# Patient Record
Sex: Male | Born: 1975 | Race: White | Hispanic: No | Marital: Married | Smoking: Current every day smoker
Health system: Southern US, Community
[De-identification: ages and names within clinical notes are randomized; demographics above are authoritative.]

## PROBLEM LIST (undated history)

## (undated) HISTORY — PX: NO PAST SURGERIES: SHX2092

---

## 2010-11-14 ENCOUNTER — Emergency Department: Payer: Self-pay | Admitting: Unknown Physician Specialty

## 2012-04-14 IMAGING — CR DG SHOULDER 3+V*L*
1 series · 3 of 3 positions shown · non-contrast
Comparison: none

REASON FOR EXAM: fall; pain
COMMENTS:

PROCEDURE:     DXR - DXR SHOULDER LEFT COMPLETE  - November 14, 2010  [DATE]
RESULT:
A comminuted mid to distal clavicle fracture is identified demonstrating
approximately 3 cm of override. There is inferior displacement of the distal
fragment.

[Series 1: view not recorded · 0.17mm/px · 3 of 3 slices shown]
[im 1/3]
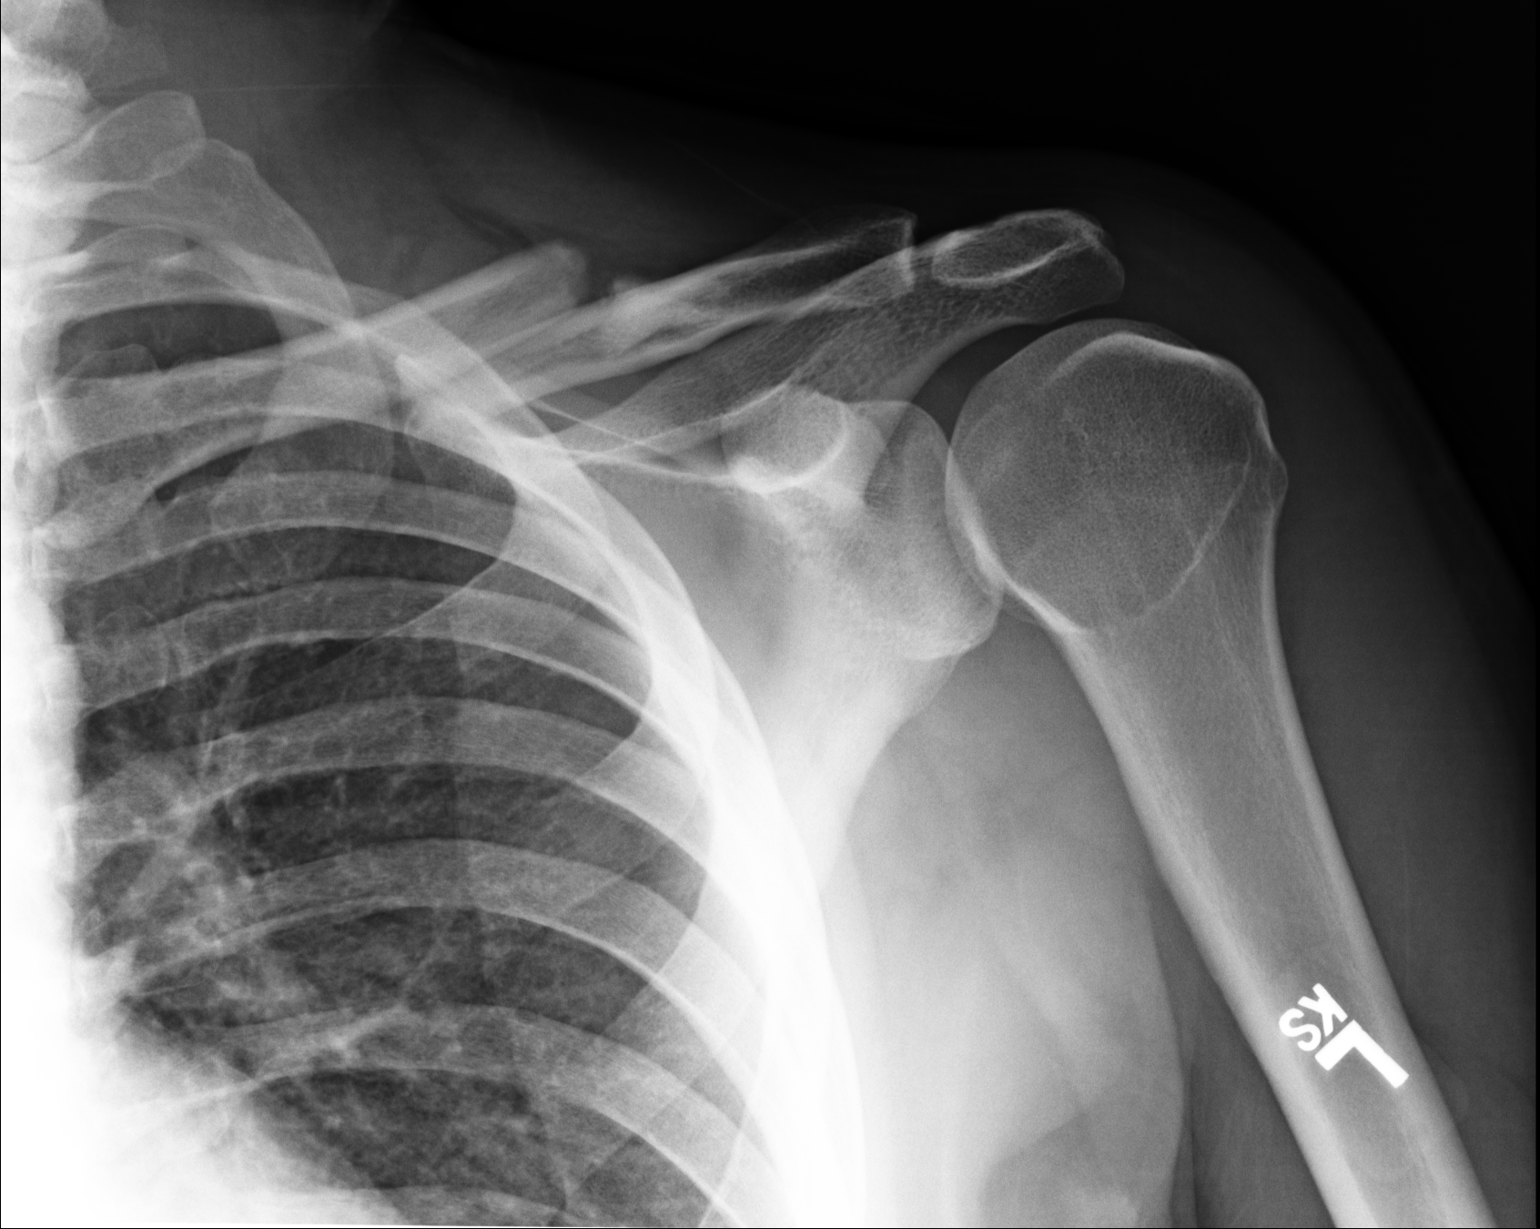
[im 2/3]
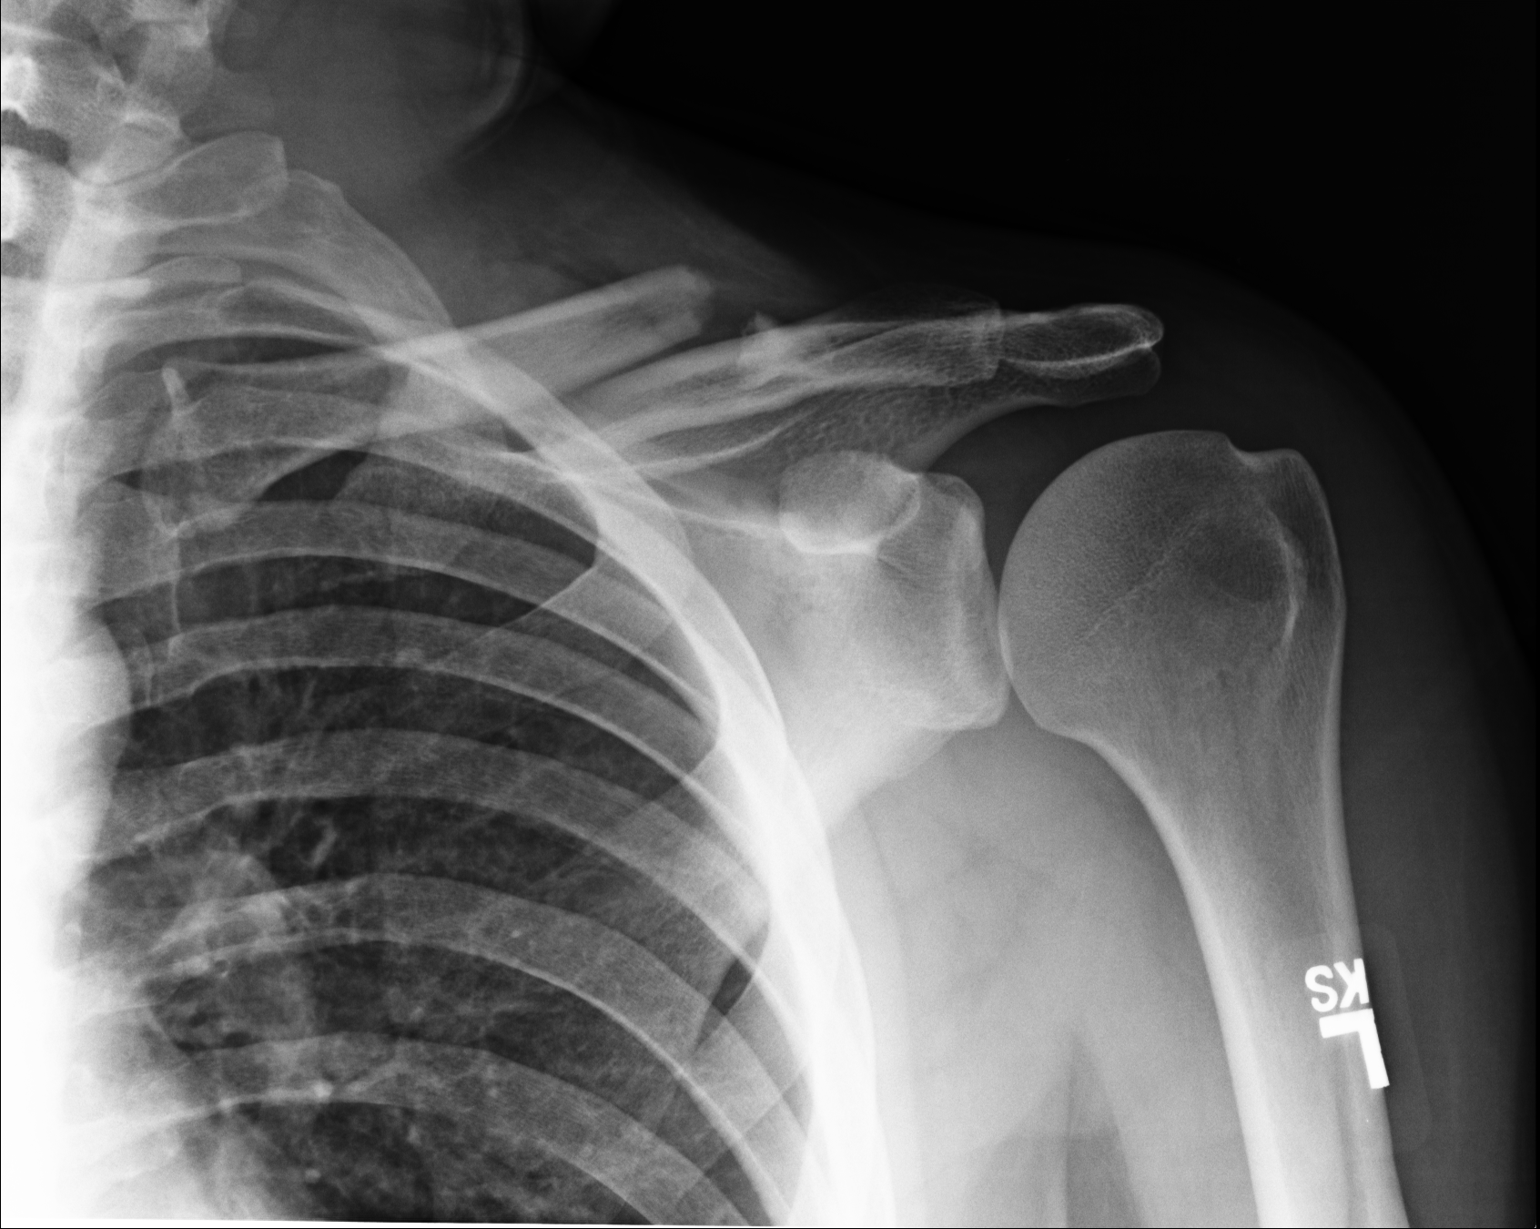
[im 3/3]
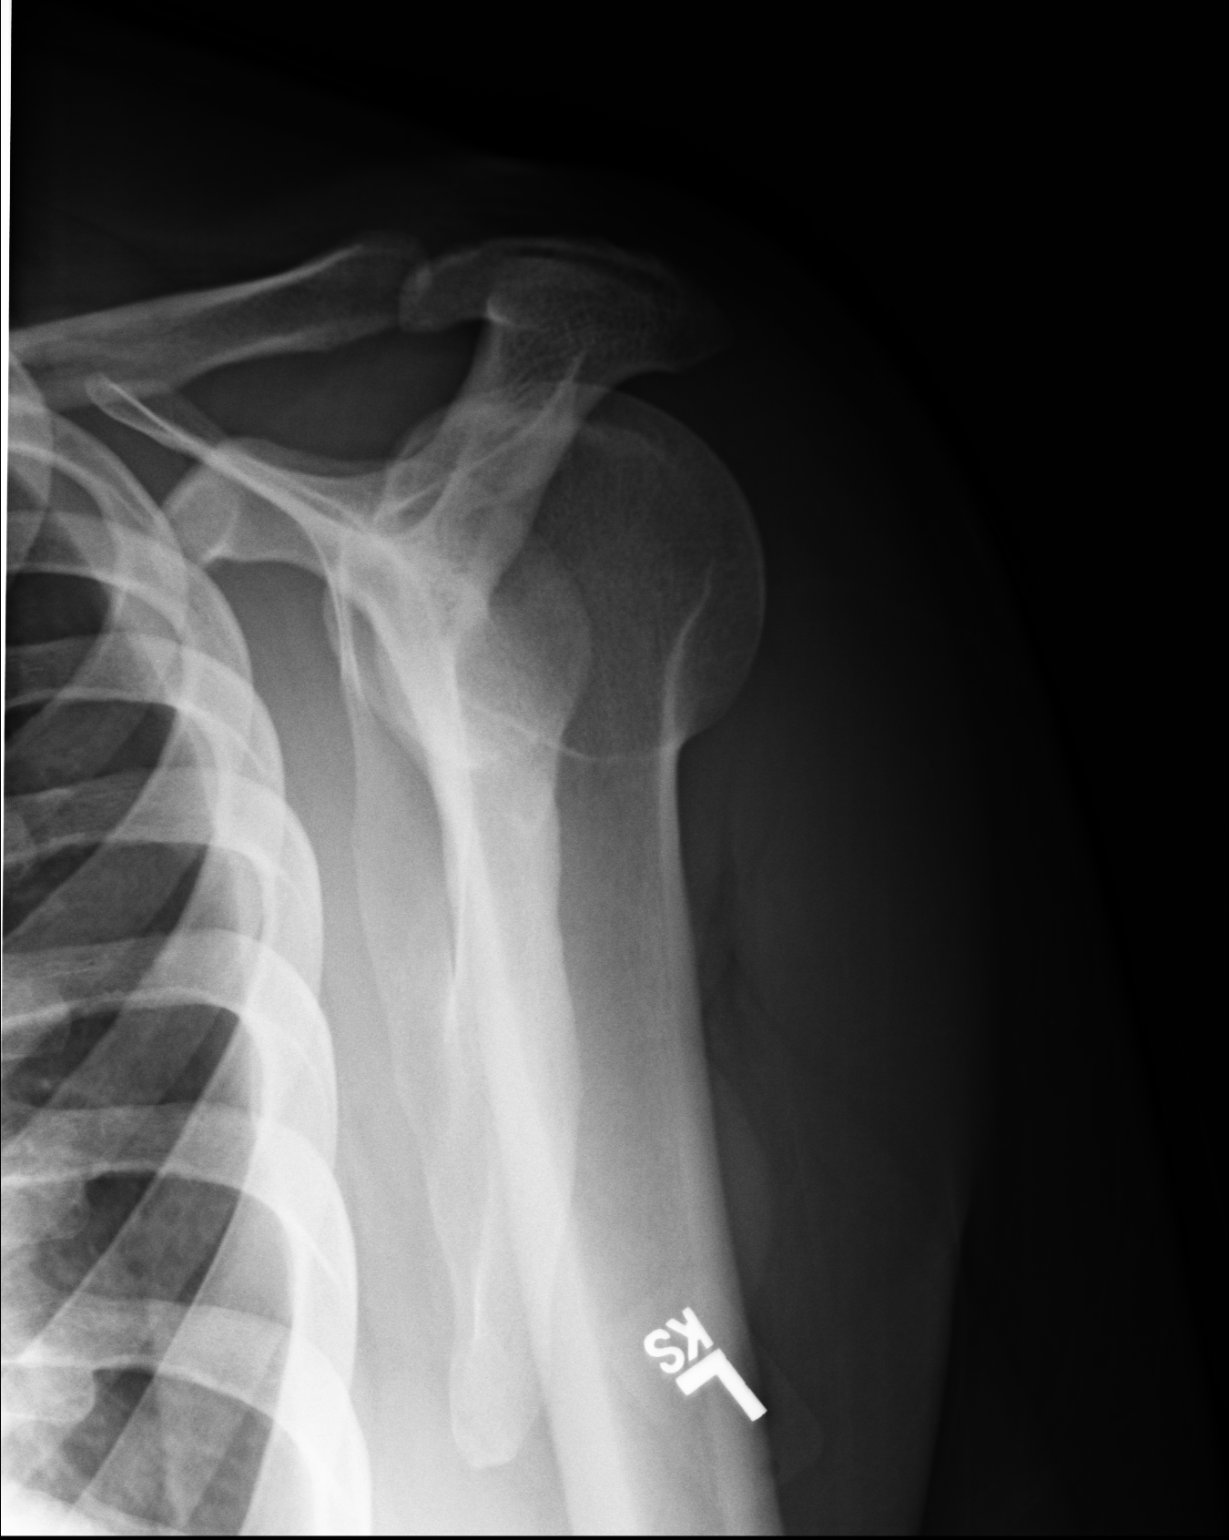

[3 of 3 positions shown; findings below may reference images not displayed]

IMPRESSION: Comminuted clavicle fracture.

## 2016-11-29 ENCOUNTER — Encounter: Payer: Self-pay | Admitting: Emergency Medicine

## 2016-11-29 ENCOUNTER — Ambulatory Visit
Admission: EM | Admit: 2016-11-29 | Discharge: 2016-11-29 | Disposition: A | Discharge status: Home / Self Care | Payer: No Typology Code available for payment source | Attending: Physician Assistant | Admitting: Physician Assistant

## 2016-11-29 DIAGNOSIS — J329 Chronic sinusitis, unspecified: Secondary | ICD-10-CM | POA: Diagnosis not present

## 2016-11-29 DIAGNOSIS — J45909 Unspecified asthma, uncomplicated: Secondary | ICD-10-CM

## 2016-11-29 DIAGNOSIS — J3489 Other specified disorders of nose and nasal sinuses: Secondary | ICD-10-CM | POA: Diagnosis not present

## 2016-11-29 MED ORDER — AZITHROMYCIN 250 MG PO TABS: 250.0000 mg | ORAL_TABLET | Freq: Every day | ORAL | 0 refills | Status: DC

## 2016-11-29 NOTE — ED Provider Notes (Signed)
CSN: [id number]     Arrival date & time 11/29/16  1546 History   First MD Initiated Contact with Patient 11/29/16 1657     Chief Complaint  Patient presents with  . Sinusitis   (Consider location/radiation/quality/duration/timing/severity/associated sxs/prior Treatment) Patient is a 41 year old male with history of nonspecific asthma who presents with a complaint of sinus pressure. Patient states his symptoms are as a head cold last week and initially thought he was starting to feel better now reports sinus pressure under his eyes with some mild headache. Patient states he has taken TheraFlu and DayQuil which is help with his nasal congestion and itchy throat but not with the pressure. Patient denies any fever, chills, chest pain, shortness of breath, abdominal pain, and denies any nausea. Patient states his symptoms are not affecting his asthma this time. Patient denies any history of seasonal allergy issues.      History reviewed. No pertinent past medical history. History reviewed. No pertinent surgical history. History reviewed. No pertinent family history. Social History  Substance Use Topics  . Smoking status: Current Every Day Smoker    Packs/day: 0.50  . Smokeless tobacco: Never Used  . Alcohol use Yes    Review of Systems  Constitutional: Negative for chills and fatigue.  HENT: Positive for congestion, rhinorrhea, sinus pressure and sore throat.   Respiratory: Negative for cough, chest tightness and shortness of breath.   Cardiovascular: Negative for chest pain.  Gastrointestinal: Positive for abdominal pain. Negative for nausea.  All other systems reviewed and are negative.   Allergies  Patient has no known allergies.  Home Medications   Prior to Admission medications   Medication Sig Start Date End Date Taking? Authorizing Provider  azithromycin (ZITHROMAX Z-PAK) 250 MG tablet Take 1 tablet (250 mg total) by mouth daily. Take 2 tablets by mouth the first day  followed by one tablet daily for next 4 days. 11/29/16   Candis Schatz, PA-C   Meds Ordered and Administered this Visit  Medications - No data to display  BP 119/77 (BP Location: Left Arm)   Pulse 72   Temp 97.8 F (36.6 C) (Oral)   Resp 18   Ht 6' (1.829 m)   Wt 232 lb (105.2 kg)   SpO2 97%   BMI 31.46 kg/m  No data found.   Physical Exam  Constitutional: He is oriented to person, place, and time. He appears well-developed and well-nourished.  HENT:  Head: Normocephalic and atraumatic.  Right Ear: No mastoid tenderness. Tympanic membrane is bulging. No middle ear effusion.  Left Ear: No mastoid tenderness. Tympanic membrane is bulging.  No middle ear effusion.  Nose: Mucosal edema and rhinorrhea present. Right sinus exhibits maxillary sinus tenderness and frontal sinus tenderness. Left sinus exhibits maxillary sinus tenderness and frontal sinus tenderness.  Mouth/Throat: Uvula is midline and mucous membranes are normal. Posterior oropharyngeal erythema present.  Eyes: Conjunctivae and EOM are normal. Pupils are equal, round, and reactive to light.  Neck: Normal range of motion. Neck supple.  Cardiovascular: Normal rate, regular rhythm and normal heart sounds.   Pulmonary/Chest: Effort normal and breath sounds normal. He has no wheezes.  Abdominal: Soft.  Musculoskeletal: Normal range of motion.  Lymphadenopathy:    He has no cervical adenopathy.  Neurological: He is alert and oriented to person, place, and time.    Urgent Care Course     Procedures (including critical care time)  Labs Review Labs Reviewed - No data to display  Imaging Review No  results found.    MDM   1. Uncomplicated asthma, unspecified asthma severity, unspecified whether persistent   2. Sinus pressure   3. Sinusitis, unspecified chronicity, unspecified location    New Prescriptions   AZITHROMYCIN (ZITHROMAX Z-PAK) 250 MG TABLET    Take 1 tablet (250 mg total) by mouth daily. Take 2  tablets by mouth the first day followed by one tablet daily for next 4 days.  Patient presents with sinus pressure that began 1 week ago as a "head cold" initially thought was getting better. Patient is treated with TheraFlu and DayQuil which has helped with his congestion and scratchy throat but he reports the sinus pressure is worsening. If patient a prescription for azithromycin and instructions regarding nasal rinse. Patient also advised used Tylenol and ibuprofen for pain as needed. Patient advised to follow with here with his primary care physician as needed should his symptoms worsen or not improve. Patient verbalizes understanding and agreement with the plan.   Candis SchatzMichael D Jackilyn Umphlett, PA-C    Candis SchatzHarris, Khaden Gater D, PA-C 11/29/16 [phone]

## 2016-11-29 NOTE — Discharge Instructions (Signed)
-  Azithromycin: Take 2 tablets by mouth the first day followed by one tablet daily for next 4 days. -can use OTC nasal saline rinse to help clear out sinuses and relieve pressure -can use Tylenol and ibuprofen for pain -drink plenty of fluids -follow up with PCP or here if symptoms worsen or do not improve

## 2016-11-29 NOTE — ED Triage Notes (Signed)
Patient states he felt like he was getting a cold last week and today is having a lot of sinus pressure and pain

## 2021-01-23 ENCOUNTER — Other Ambulatory Visit: Payer: Self-pay

## 2021-01-23 ENCOUNTER — Ambulatory Visit
Admission: RE | Admit: 2021-01-23 | Discharge: 2021-01-23 | Disposition: A | Discharge status: Home / Self Care | Payer: 59 | Source: Ambulatory Visit | Attending: Family Medicine | Admitting: Family Medicine

## 2021-01-23 VITALS — BP 125/86 | HR 75 | Temp 97.8°F | Resp 16 | Ht 72.0 in | Wt 225.0 lb

## 2021-01-23 DIAGNOSIS — J0100 Acute maxillary sinusitis, unspecified: Secondary | ICD-10-CM

## 2021-01-23 MED ORDER — AMOXICILLIN-POT CLAVULANATE 875-125 MG PO TABS: 1.0000 | ORAL_TABLET | Freq: Two times a day (BID) | ORAL | 0 refills | Status: AC

## 2021-01-23 NOTE — ED Triage Notes (Signed)
Patient c/o nasal congestion, sinus pressure and congestion that started a week ago.  Patient denies fevers.

## 2021-01-23 NOTE — ED Provider Notes (Signed)
[facility]    CSN: [id number] Arrival date & time: 01/23/21  4656  History   Chief Complaint Chief Complaint  Patient presents with   Sinus Problem   Nasal Congestion    HPI 45 year old male presents with the above complaint.  Patient reports that he has had a 1 week history of sinus pressure and congestion.  He also reports chest congestion.  No resolution with over-the-counter treatment.  No fever.  No reported sick contacts.  He has a smoker.  No other associated symptoms.  No other complaints.  Home Medications    Prior to Admission medications   Medication Sig Start Date End Date Taking? Authorizing Provider  amoxicillin-clavulanate (AUGMENTIN) 875-125 MG tablet Take 1 tablet by mouth 2 (two) times daily. 01/23/21  Yes Tommie Sams, DO   Social History Social History   Tobacco Use   Smoking status: Every Day    Packs/day: 0.50    Types: Cigarettes   Smokeless tobacco: Never  Vaping Use   Vaping Use: Never used  Substance Use Topics   Alcohol use: Yes   Drug use: Never     Allergies   Patient has no known allergies.   Review of Systems Review of Systems Per HPI  Physical Exam Triage Vital Signs ED Triage Vitals  Enc Vitals Group     BP 01/23/21 0945 125/86     Pulse Rate 01/23/21 0945 75     Resp 01/23/21 0945 16     Temp 01/23/21 0945 97.8 F (36.6 C)     Temp Source 01/23/21 0945 Oral     SpO2 01/23/21 0945 96 %     Weight 01/23/21 0942 225 lb (102.1 kg)     Height 01/23/21 0942 6' (1.829 m)     Head Circumference --      Peak Flow --      Pain Score 01/23/21 0942 0     Pain Loc --      Pain Edu? --      Excl. in GC? --    Updated Vital Signs BP 125/86 (BP Location: Right Arm)   Pulse 75   Temp 97.8 F (36.6 C) (Oral)   Resp 16   Ht 6' (1.829 m)   Wt 102.1 kg   SpO2 96%   BMI 30.52 kg/m   Visual Acuity Right Eye Distance:   Left Eye Distance:   Bilateral Distance:    Right Eye Near:   Left Eye Near:     Bilateral Near:     Physical Exam Vitals and nursing note reviewed.  Constitutional:      General: He is not in acute distress.    Appearance: Normal appearance. He is not ill-appearing.  HENT:     Head: Normocephalic and atraumatic.     Right Ear: Tympanic membrane normal.     Left Ear: Tympanic membrane normal.     Nose: Congestion present.     Mouth/Throat:     Pharynx: No oropharyngeal exudate.  Eyes:     General:        Right eye: No discharge.        Left eye: No discharge.     Conjunctiva/sclera: Conjunctivae normal.  Cardiovascular:     Rate and Rhythm: Normal rate and regular rhythm.     Heart sounds: No murmur heard. Pulmonary:     Effort: Pulmonary effort is normal.     Breath sounds: Normal breath sounds. No wheezing, rhonchi or rales.  Neurological:     Mental Status: He is alert.  Psychiatric:        Mood and Affect: Mood normal.        Behavior: Behavior normal.     UC Treatments / Results  Labs (all labs ordered are listed, but only abnormal results are displayed) Labs Reviewed - No data to display  EKG   Radiology No results found.  Procedures Procedures (including critical care time)  Medications Ordered in UC Medications - No data to display  Initial Impression / Assessment and Plan / UC Course  I have reviewed the triage vital signs and the nursing notes.  Pertinent labs & imaging results that were available during my care of the patient were reviewed by me and considered in my medical decision making (see chart for details).    45 year old male presents with sinusitis.  Treating with Augmentin.  Final Clinical Impressions(s) / UC Diagnoses   Final diagnoses:  Acute maxillary sinusitis, recurrence not specified   Discharge Instructions   None    ED Prescriptions     Medication Sig Dispense Auth. Provider   amoxicillin-clavulanate (AUGMENTIN) 875-125 MG tablet Take 1 tablet by mouth 2 (two) times daily. 14 tablet Tommie Sams, DO      PDMP not reviewed this encounter.   Tommie Sams, [place] 01/23/21 1048

## 2022-02-28 ENCOUNTER — Emergency Department
Admission: EM | Admit: 2022-02-28 | Discharge: 2022-02-28 | Disposition: A | Discharge status: Home / Self Care | Payer: 59 | Attending: Emergency Medicine | Admitting: Emergency Medicine

## 2022-02-28 ENCOUNTER — Emergency Department: Payer: 59

## 2022-02-28 ENCOUNTER — Other Ambulatory Visit: Payer: Self-pay

## 2022-02-28 DIAGNOSIS — S61211A Laceration without foreign body of left index finger without damage to nail, initial encounter: Secondary | ICD-10-CM | POA: Diagnosis not present

## 2022-02-28 DIAGNOSIS — S61412A Laceration without foreign body of left hand, initial encounter: Secondary | ICD-10-CM

## 2022-02-28 DIAGNOSIS — Z23 Encounter for immunization: Secondary | ICD-10-CM | POA: Diagnosis not present

## 2022-02-28 DIAGNOSIS — W278XXA Contact with other nonpowered hand tool, initial encounter: Secondary | ICD-10-CM | POA: Diagnosis not present

## 2022-02-28 DIAGNOSIS — S6992XA Unspecified injury of left wrist, hand and finger(s), initial encounter: Secondary | ICD-10-CM | POA: Diagnosis present

## 2022-02-28 MED ORDER — LIDOCAINE HCL (PF) 1 % IJ SOLN
5.0000 mL | Freq: Once | INTRAMUSCULAR | Status: AC
  Administered 2022-02-28: 5 mL
  Filled 2022-02-28: qty 5

## 2022-02-28 MED ORDER — TETANUS-DIPHTH-ACELL PERTUSSIS 5-2.5-18.5 LF-MCG/0.5 IM SUSY
0.5000 mL | PREFILLED_SYRINGE | Freq: Once | INTRAMUSCULAR | Status: AC
  Administered 2022-02-28: 0.5 mL via INTRAMUSCULAR
  Filled 2022-02-28: qty 0.5

## 2022-02-28 MED ORDER — BACITRACIN ZINC 500 UNIT/GM EX OINT
TOPICAL_OINTMENT | CUTANEOUS | Status: AC
  Filled 2022-02-28: qty 0.9

## 2022-02-28 NOTE — Discharge Instructions (Signed)
   Please keep your wound clean by washing at least daily with soap and water. If you see any signs of infection like spreading redness, pus coming from the wound, extreme pain, fevers, chills or any other worsening doctor right away or come back to the emergency department.  Use antibiotic ointment after washing and apply a bandage.   Thank you for choosing [place] for your health care today!  Please see your primary doctor this week for a follow up appointment. Get sutures removed in 7 days.   If you do not have a primary doctor call the following clinics to establish care:  If you have insurance:  [facility] [phone] [address] [place] [place] [address]   [facility]  [phone] [address] [place] [place] [address]   If you do not have insurance:  [facility]  [phone] [address], [place] [address]  Sometimes, in the early stages of certain disease courses it is difficult to detect in the emergency department evaluation -- so, it is important that you continue to monitor your symptoms and call your doctor right away or return to the emergency department if you develop any new or worsening symptoms.  It was my pleasure to care for you today.   Daneil Dan Modesto Charon, MD

## 2022-02-28 NOTE — ED Provider Notes (Signed)
[facility] Provider Note    Event Date/Time   First MD Initiated Contact with Patient 02/28/22 1429     (approximate)   History   Laceration   HPI  Benjamin Lyons is a 46 y.o. male   This is a very pleasant 46 year old man who presents with his wife after an injury sustained to his left dorsum of index finger after cutting with a box cutter accidentally.  No other injuries.  History was obtained via patient and his wife who is at bedside      Physical Exam   Triage Vital Signs: ED Triage Vitals  Enc Vitals Group     BP 02/28/22 1346 110/78     Pulse Rate 02/28/22 1346 80     Resp 02/28/22 1346 18     Temp 02/28/22 1346 97.7 F (36.5 C)     Temp Source 02/28/22 1346 Oral     SpO2 02/28/22 1346 95 %     Weight 02/28/22 1347 230 lb (104.3 kg)     Height 02/28/22 1347 6' (1.829 m)     Head Circumference --      Peak Flow --      Pain Score 02/28/22 1346 0     Pain Loc --      Pain Edu? --      Excl. in GC? --     Most recent vital signs: Vitals:   02/28/22 1346  BP: 110/78  Pulse: 80  Resp: 18  Temp: 97.7 F (36.5 C)  SpO2: 95%    General: Awake, no distress.  CV:  Good peripheral perfusion.  Resp:  Normal effort.  Abd:  No distention.  Other:  He has a 3 cm laceration to the dorsum of the left hand at the base of the index finger.  Gaping.  Hemostatic.  Full active range of motion, neurovascular intact.   ED Results / Procedures / Treatments     RADIOLOGY I independently reviewed and interpreted x-ray of the hand and see no obvious fractures, dislocations or foreign bodies.   PROCEDURES:  Critical Care performed: No  ..Laceration Repair  Date/Time: 02/28/2022 5:01 PM  Performed by: Pilar Jarvis, MD Authorized by: Pilar Jarvis, MD   Consent:    Consent obtained:  Verbal   Consent given by:  Patient   Risks, benefits, and alternatives were discussed: yes     Risks discussed:  Infection, pain, need for additional  repair and poor cosmetic result   Alternatives discussed:  No treatment and delayed treatment Universal protocol:    Procedure explained and questions answered to patient or proxy's satisfaction: yes     Relevant documents present and verified: yes     Test results available: no     Imaging studies available: yes     Patient identity confirmed:  Verbally with patient Anesthesia:    Anesthesia method:  Local infiltration   Local anesthetic:  Lidocaine 1% w/o epi Laceration details:    Location:  Hand   Hand location:  L hand, dorsum   Length (cm):  3   Depth (mm):  3 Pre-procedure details:    Preparation:  Patient was prepped and draped in usual sterile fashion and imaging obtained to evaluate for foreign bodies Exploration:    Limited defect created (wound extended): no     Hemostasis achieved with:  Direct pressure   Imaging obtained: x-ray     Imaging outcome: foreign body not noted     Wound  exploration: wound explored through full range of motion     Wound extent: no fascia violation noted, no foreign bodies/material noted and no muscle damage noted     Contaminated: no   Treatment:    Area cleansed with:  Saline   Amount of cleaning:  Standard   Irrigation solution:  Tap water   Irrigation method:  Tap   Visualized foreign bodies/material removed: no     Debridement:  None   Undermining:  None   Scar revision: no   Skin repair:    Repair method:  Sutures   Suture size:  5-0   Suture material:  Nylon   Suture technique:  Simple interrupted   Number of sutures:  4 Approximation:    Approximation:  Close Repair type:    Repair type:  Simple Post-procedure details:    Dressing:  Antibiotic ointment and adhesive bandage   Procedure completion:  Tolerated    MEDICATIONS ORDERED IN ED: Medications  lidocaine (PF) (XYLOCAINE) 1 % injection 5 mL (5 mLs Other Given 02/28/22 1700)  Tdap (BOOSTRIX) injection 0.5 mL (0.5 mLs Intramuscular Given 02/28/22 1659)  bacitracin  500 UNIT/GM ointment (  Given 02/28/22 1700)     IMPRESSION / MDM / ASSESSMENT AND PLAN / ED COURSE  I reviewed the triage vital signs and the nursing notes.                              Differential diagnosis includes, but is not limited to, fracture, dislocation, laceration, assess for but less likely tendon injury given full range of motion, assess for but less likely nerve injury given sensate.   MDM: lac repair as above, Tdap, advised on wound care and follow-up instructions.   Patient's presentation is most consistent with acute, uncomplicated illness.       FINAL CLINICAL IMPRESSION(S) / ED DIAGNOSES   Final diagnoses:  Laceration of left hand without foreign body, initial encounter     Rx / DC Orders   ED Discharge Orders     None        Note:  This document was prepared using Dragon voice recognition software and may include unintentional dictation errors.    Pilar Jarvis, MD 02/28/22 [phone]

## 2022-02-28 NOTE — ED Triage Notes (Signed)
Pt states he cut the top of his L hand with a knife while laying flooring- pt has about an inch cut on his pointer finger on the L hand

## 2024-05-23 ENCOUNTER — Encounter: Payer: Self-pay | Admitting: Gastroenterology

## 2024-07-09 ENCOUNTER — Ambulatory Visit: Admitting: Certified Registered"

## 2024-07-09 ENCOUNTER — Encounter: Payer: Self-pay | Admitting: Gastroenterology

## 2024-07-09 ENCOUNTER — Encounter
Admission: RE | Disposition: A | Discharge status: Home / Self Care | Payer: Self-pay | Source: Home / Self Care | Attending: Gastroenterology

## 2024-07-09 ENCOUNTER — Other Ambulatory Visit: Payer: Self-pay

## 2024-07-09 ENCOUNTER — Ambulatory Visit
Admission: RE | Admit: 2024-07-09 | Discharge: 2024-07-09 | Disposition: A | Attending: Gastroenterology | Admitting: Gastroenterology

## 2024-07-09 DIAGNOSIS — F17200 Nicotine dependence, unspecified, uncomplicated: Secondary | ICD-10-CM | POA: Insufficient documentation

## 2024-07-09 DIAGNOSIS — Z1211 Encounter for screening for malignant neoplasm of colon: Secondary | ICD-10-CM | POA: Diagnosis present

## 2024-07-09 DIAGNOSIS — K621 Rectal polyp: Secondary | ICD-10-CM | POA: Insufficient documentation

## 2024-07-09 HISTORY — PX: POLYPECTOMY: SHX149

## 2024-07-09 HISTORY — PX: COLONOSCOPY: SHX5424

## 2024-07-09 SURGERY — COLONOSCOPY
Anesthesia: General

## 2024-07-09 MED ORDER — SODIUM CHLORIDE 0.9 % IV SOLN: INTRAVENOUS | Status: DC

## 2024-07-09 MED ORDER — LIDOCAINE HCL (CARDIAC) PF 100 MG/5ML IV SOSY
PREFILLED_SYRINGE | INTRAVENOUS | Status: DC | PRN
  Administered 2024-07-09: 100 mg via INTRAVENOUS

## 2024-07-09 MED ORDER — PROPOFOL 500 MG/50ML IV EMUL
INTRAVENOUS | Status: DC | PRN
  Administered 2024-07-09: 120 ug/kg/min via INTRAVENOUS
  Administered 2024-07-09: 120 mg via INTRAVENOUS

## 2024-07-09 NOTE — Anesthesia Postprocedure Evaluation (Signed)
"   Anesthesia Post Note  Patient: Benjamin Lyons  Procedure(s) Performed: COLONOSCOPY POLYPECTOMY, INTESTINE  Patient location during evaluation: Endoscopy Anesthesia Type: General Level of consciousness: awake and alert Pain management: pain level controlled Vital Signs Assessment: post-procedure vital signs reviewed and stable Respiratory status: spontaneous breathing, nonlabored ventilation, respiratory function stable and patient connected to nasal cannula oxygen Cardiovascular status: blood pressure returned to baseline and stable Postop Assessment: no apparent nausea or vomiting Anesthetic complications: no   No notable events documented.   Last Vitals:  Vitals:   07/09/24 1036 07/09/24 1046  BP: 114/85 108/85  Pulse: 80 79  Resp: 20 18  Temp:    SpO2: 96% 98%    Last Pain:  Vitals:   07/09/24 1046  TempSrc:   PainSc: 0-No pain                 Fairy A Marcianna Daily      "

## 2024-07-09 NOTE — Anesthesia Preprocedure Evaluation (Signed)
"                                    Anesthesia Evaluation  Patient identified by MRN, date of birth, ID band Patient awake    Reviewed: Allergy & Precautions, H&P , NPO status , Patient's Chart, lab work & pertinent test results  Airway Mallampati: II  TM Distance: >3 FB Neck ROM: Full    Dental no notable dental hx.    Pulmonary neg pulmonary ROS, Current Smoker and Patient abstained from smoking.   Pulmonary exam normal breath sounds clear to auscultation       Cardiovascular negative cardio ROS Normal cardiovascular exam Rhythm:Regular Rate:Normal     Neuro/Psych negative neurological ROS  negative psych ROS   GI/Hepatic negative GI ROS, Neg liver ROS,,,  Endo/Other  negative endocrine ROS    Renal/GU negative Renal ROS  negative genitourinary   Musculoskeletal negative musculoskeletal ROS (+)    Abdominal   Peds negative pediatric ROS (+)  Hematology negative hematology ROS (+)   Anesthesia Other Findings   Reproductive/Obstetrics negative OB ROS                              Anesthesia Physical Anesthesia Plan  ASA: 2  Anesthesia Plan: General   Post-op Pain Management:    Induction: Intravenous  PONV Risk Score and Plan: 0  Airway Management Planned:   Additional Equipment:   Intra-op Plan:   Post-operative Plan: Extubation in OR  Informed Consent: I have reviewed the patients History and Physical, chart, labs and discussed the procedure including the risks, benefits and alternatives for the proposed anesthesia with the patient or authorized representative who has indicated his/her understanding and acceptance.     Dental advisory given  Plan Discussed with: CRNA  Anesthesia Plan Comments:         Anesthesia Quick Evaluation  "

## 2024-07-09 NOTE — Interval H&P Note (Signed)
 History and Physical Interval Note: Preprocedure H&P from 07/09/2024  was reviewed and there was no interval change after seeing and examining the patient.  Written consent was obtained from the patient after discussion of risks, benefits, and alternatives. Patient has consented to proceed with Colonoscopy with possible intervention   07/09/2024 9:56 AM  Benjamin Lyons  has presented today for surgery, with the diagnosis of Colon cancer screening (Z12.11).  The various methods of treatment have been discussed with the patient and family. After consideration of risks, benefits and other options for treatment, the patient has consented to  Procedures: COLONOSCOPY (N/A) as a surgical intervention.  The patient's history has been reviewed, patient examined, no change in status, stable for surgery.  I have reviewed the patient's chart and labs.  Questions were answered to the patient's satisfaction.     Elspeth Ozell Jungling

## 2024-07-09 NOTE — Op Note (Signed)
 [facility] Patient Name: Benjamin Lyons Procedure Date: 07/09/2024 9:49 AM MRN: [id number] Account #: [id number] Date of Birth: Mar 30, 1976 Admit Type: Outpatient Age: 49 Room: [facility] ENDO ROOM 2 Gender: Male Note Status: Finalized Instrument Name: Colon Scope [id number] Procedure:             Colonoscopy Indications:           Screening for colorectal malignant neoplasm Providers:             Elspeth Ozell Onita ROSALEA, DO Referring MD:          Elspeth Ozell Onita DO, DO (Referring MD), Layman MICAEL Piety MD, MD (Referring MD) Medicines:             Monitored Anesthesia Care Complications:         No immediate complications. Estimated blood loss:                         Minimal. Procedure:             Pre-Anesthesia Assessment:                        - Prior to the procedure, a History and Physical was                         performed, and patient medications and allergies were                         reviewed. The patient is competent. The risks and                         benefits of the procedure and the sedation options and                         risks were discussed with the patient. All questions                         were answered and informed consent was obtained.                         Patient identification and proposed procedure were                         verified by the physician, the nurse, the anesthetist                         and the technician in the endoscopy suite. Mental                         Status Examination: alert and oriented. Airway                         Examination: normal oropharyngeal airway and neck                         mobility. Respiratory Examination: clear to  auscultation. CV Examination: RRR, no murmurs, no S3                         or S4. Prophylactic Antibiotics: The patient does not                         require prophylactic antibiotics. Prior                          Anticoagulants: The patient has taken no anticoagulant                         or antiplatelet agents. ASA Grade Assessment: II - A                         patient with mild systemic disease. After reviewing                         the risks and benefits, the patient was deemed in                         satisfactory condition to undergo the procedure. The                         anesthesia plan was to use monitored anesthesia care                         (MAC). Immediately prior to administration of                         medications, the patient was re-assessed for adequacy                         to receive sedatives. The heart rate, respiratory                         rate, oxygen saturations, blood pressure, adequacy of                         pulmonary ventilation, and response to care were                         monitored throughout the procedure. The physical                         status of the patient was re-assessed after the                         procedure.                        After obtaining informed consent, the colonoscope was                         passed under direct vision. Throughout the procedure,                         the patient's blood pressure, pulse, and oxygen  saturations were monitored continuously. The                         Colonoscope was introduced through the anus and                         advanced to the the terminal ileum, with                         identification of the appendiceal orifice and IC                         valve. The colonoscopy was performed without                         difficulty. The patient tolerated the procedure well.                         The quality of the bowel preparation was evaluated                         using the BBPS [facility] Bowel Preparation Scale) with                         scores of: Right Colon = 3, Transverse Colon = 3 and                         Left Colon  = 3 (entire mucosa seen well with no                         residual staining, small fragments of stool or opaque                         liquid). The total BBPS score equals 9. The terminal                         ileum, ileocecal valve, appendiceal orifice, and                         rectum were photographed. Findings:      The perianal and digital rectal examinations were normal. Pertinent       negatives include normal sphincter tone.      The terminal ileum appeared normal. Estimated blood loss: none.      Retroflexion in the right colon was performed.      A 1 mm polyp was found in the rectum. The polyp was sessile. The polyp       was removed with a jumbo cold forceps. Resection and retrieval were       complete. Estimated blood loss was minimal.      The exam was otherwise without abnormality on direct and retroflexion       views. Impression:            - The examined portion of the ileum was normal.                        - One 1 mm polyp in the rectum, removed with a jumbo  cold forceps. Resected and retrieved.                        - The examination was otherwise normal on direct and                         retroflexion views. Recommendation:        - Patient has a contact number available for                         emergencies. The signs and symptoms of potential                         delayed complications were discussed with the patient.                         Return to normal activities tomorrow. Written                         discharge instructions were provided to the patient.                        - Discharge patient to home.                        - Resume previous diet.                        - Continue present medications.                        - Await pathology results.                        - Repeat colonoscopy for surveillance based on                         pathology results.                        - Return to referring  physician as previously                         scheduled.                        - The findings and recommendations were discussed with                         the patient. Procedure Code(s):     --- Professional ---                        [phone], Colonoscopy, flexible; with biopsy, single or                         multiple Diagnosis Code(s):     --- Professional ---                        Z12.11, Encounter for screening for malignant neoplasm  of colon                        D12.8, Benign neoplasm of rectum CPT copyright 2022 [employer]. All rights reserved. The codes documented in this report are preliminary and upon coder review may  be revised to meet current compliance requirements. Attending Participation:      I personally performed the entire procedure. Elspeth Jungling, DO Elspeth Ozell Jungling DO, DO 07/09/2024 10:26:05 AM This report has been signed electronically. Number of Addenda: 0 Note Initiated On: 07/09/2024 9:49 AM Scope Withdrawal Time: 0 hours 10 minutes 42 seconds  Total Procedure Duration: 0 hours 13 minutes 46 seconds  Estimated Blood Loss:  Estimated blood loss was minimal.      [facility]

## 2024-07-09 NOTE — H&P (Signed)
 "  Pre-Procedure H&P   Patient ID: Benjamin Lyons is a 49 y.o. male.  Gastroenterology Provider: Elspeth Ozell Jungling, DO  Referring Provider: Dr. Lenon  PCP: Lenon Layman ORN, MD  Date: 07/09/2024  HPI Mr. Benjamin Lyons is a 49 y.o. male who presents today for Colonoscopy for Colorectal cancer screening .  Initial screening.  No family history of colon cancer or colon polyps.  History reviewed. No pertinent past medical history.  Past Surgical History:  Procedure Laterality Date   NO PAST SURGERIES      Family History No h/o GI disease or malignancy  Review of Systems  Constitutional:  Negative for activity change, appetite change, chills, diaphoresis, fatigue, fever and unexpected weight change.  HENT:  Negative for trouble swallowing and voice change.   Respiratory:  Negative for shortness of breath and wheezing.   Cardiovascular:  Negative for chest pain, palpitations and leg swelling.  Gastrointestinal:  Negative for abdominal distention, abdominal pain, anal bleeding, blood in stool, constipation, diarrhea, nausea and vomiting.  Musculoskeletal:  Negative for arthralgias and myalgias.  Skin:  Negative for color change and pallor.  Neurological:  Negative for dizziness, syncope and weakness.  Psychiatric/Behavioral:  Negative for confusion. The patient is not nervous/anxious.   All other systems reviewed and are negative.    Medications Medications Ordered Prior to Encounter[1]  Pertinent medications related to GI and procedure were reviewed by me with the patient prior to the procedure  Current Medications[2]  sodium chloride  20 mL/hr at 07/09/24 0936       Allergies[3] Allergies were reviewed by me prior to the procedure  Objective   Body mass index is 32.28 kg/m. Vitals:   07/09/24 0929  BP: (!) 126/90  Pulse: 78  Resp: 18  Temp: (!) 96.8 F (36 C)  TempSrc: Temporal  SpO2: 95%  Weight: 108 kg  Height: 6' (1.829 m)     Physical  Exam Vitals and nursing note reviewed.  Constitutional:      General: He is not in acute distress.    Appearance: Normal appearance. He is not ill-appearing, toxic-appearing or diaphoretic.  HENT:     Head: Normocephalic and atraumatic.     Nose: Nose normal.     Mouth/Throat:     Mouth: Mucous membranes are moist.     Pharynx: Oropharynx is clear.  Eyes:     General: No scleral icterus.    Extraocular Movements: Extraocular movements intact.  Cardiovascular:     Rate and Rhythm: Normal rate and regular rhythm.     Heart sounds: Normal heart sounds. No murmur heard.    No friction rub. No gallop.  Pulmonary:     Effort: Pulmonary effort is normal. No respiratory distress.     Breath sounds: Normal breath sounds. No wheezing, rhonchi or rales.  Abdominal:     General: Bowel sounds are normal. There is no distension.     Palpations: Abdomen is soft.     Tenderness: There is no abdominal tenderness. There is no guarding or rebound.  Musculoskeletal:     Cervical back: Neck supple.     Right lower leg: No edema.     Left lower leg: No edema.  Skin:    General: Skin is warm and dry.     Coloration: Skin is not jaundiced or pale.  Neurological:     General: No focal deficit present.     Mental Status: He is alert and oriented to person, place, and time. Mental status  is at baseline.  Psychiatric:        Mood and Affect: Mood normal.        Behavior: Behavior normal.        Thought Content: Thought content normal.        Judgment: Judgment normal.      Assessment:  Mr. Benjamin Lyons is a 49 y.o. male  who presents today for Colonoscopy for Colorectal cancer screening .  Plan:  Colonoscopy with possible intervention today  Colonoscopy with possible biopsy, control of bleeding, polypectomy, and interventions as necessary has been discussed with the patient/patient representative. Informed consent was obtained from the patient/patient representative after explaining the  indication, nature, and risks of the procedure including but not limited to death, bleeding, perforation, missed neoplasm/lesions, cardiorespiratory compromise, and reaction to medications. Opportunity for questions was given and appropriate answers were provided. Patient/patient representative has verbalized understanding is amenable to undergoing the procedure.   Elspeth Ozell Jungling, DO  [facility]  Portions of the record may have been created with voice recognition software. Occasional wrong-word or 'sound-a-like' substitutions may have occurred due to the inherent limitations of voice recognition software.  Read the chart carefully and recognize, using context, where substitutions may have occurred.     [1]  No current facility-administered medications on file prior to encounter.   Current Outpatient Medications on File Prior to Encounter  Medication Sig Dispense Refill   amoxicillin -clavulanate (AUGMENTIN ) 875-125 MG tablet Take 1 tablet by mouth 2 (two) times daily. 14 tablet 0  [2]  Current Facility-Administered Medications:    0.9 %  sodium chloride  infusion, , Intravenous, Continuous, Jungling Elspeth Ozell, DO, Last Rate: 20 mL/hr at 07/09/24 0936, New Bag at 07/09/24 0936 [3] No Known Allergies  "

## 2024-07-09 NOTE — Transfer of Care (Signed)
 Immediate Anesthesia Transfer of Care Note  Patient: Benjamin Lyons  Procedure(s) Performed: COLONOSCOPY POLYPECTOMY, INTESTINE  Patient Location: Endoscopy Unit  Anesthesia Type:General  Level of Consciousness: drowsy  Airway & Oxygen Therapy: Patient Spontanous Breathing  Post-op Assessment: Report given to RN and Post -op Vital signs reviewed and stable  Post vital signs: Reviewed and stable  Last Vitals:  Vitals Value Taken Time  BP 110/77 07/09/24 10:26  Temp 35.9 C 07/09/24 10:26  Pulse 78 07/09/24 10:27  Resp 17 07/09/24 10:27  SpO2 93 % 07/09/24 10:27  Vitals shown include unfiled device data.  Last Pain:  Vitals:   07/09/24 1026  TempSrc: Tympanic  PainSc: Asleep         Complications: No notable events documented.

## 2024-07-10 LAB — SURGICAL PATHOLOGY
